# Patient Record
Sex: Female | Born: 1995 | Race: Black or African American | Hispanic: No | Marital: Single | Smoking: Never smoker
Health system: Southern US, Community
[De-identification: ages and names within clinical notes are randomized; demographics above are authoritative.]

## PROBLEM LIST (undated history)

## (undated) DIAGNOSIS — Z789 Other specified health status: Secondary | ICD-10-CM

## (undated) HISTORY — PX: NO PAST SURGERIES: SHX2092

---

## 2019-04-20 ENCOUNTER — Emergency Department (HOSPITAL_BASED_OUTPATIENT_CLINIC_OR_DEPARTMENT_OTHER)
Admission: EM | Admit: 2019-04-20 | Discharge: 2019-04-20 | Disposition: A | Discharge status: Home / Self Care | Payer: Medicaid Other | Attending: Emergency Medicine | Admitting: Emergency Medicine

## 2019-04-20 ENCOUNTER — Emergency Department (HOSPITAL_BASED_OUTPATIENT_CLINIC_OR_DEPARTMENT_OTHER): Payer: Medicaid Other

## 2019-04-20 ENCOUNTER — Other Ambulatory Visit: Payer: Self-pay

## 2019-04-20 ENCOUNTER — Encounter (HOSPITAL_BASED_OUTPATIENT_CLINIC_OR_DEPARTMENT_OTHER): Payer: Self-pay | Admitting: Emergency Medicine

## 2019-04-20 DIAGNOSIS — Y9389 Activity, other specified: Secondary | ICD-10-CM | POA: Diagnosis not present

## 2019-04-20 DIAGNOSIS — X500XXA Overexertion from strenuous movement or load, initial encounter: Secondary | ICD-10-CM | POA: Insufficient documentation

## 2019-04-20 DIAGNOSIS — Y998 Other external cause status: Secondary | ICD-10-CM | POA: Insufficient documentation

## 2019-04-20 DIAGNOSIS — Y9289 Other specified places as the place of occurrence of the external cause: Secondary | ICD-10-CM | POA: Diagnosis not present

## 2019-04-20 DIAGNOSIS — S299XXA Unspecified injury of thorax, initial encounter: Secondary | ICD-10-CM | POA: Diagnosis present

## 2019-04-20 DIAGNOSIS — S20211A Contusion of right front wall of thorax, initial encounter: Secondary | ICD-10-CM | POA: Diagnosis not present

## 2019-04-20 MED ORDER — IBUPROFEN 100 MG/5ML PO SUSP
400.0000 mg | Freq: Once | ORAL | Status: AC
  Administered 2019-04-20: 400 mg via ORAL

## 2019-04-20 MED ORDER — IBUPROFEN 100 MG/5ML PO SUSP
ORAL | Status: AC
  Filled 2019-04-20: qty 20

## 2019-04-20 MED ORDER — NAPROXEN 250 MG PO TABS
500.0000 mg | ORAL_TABLET | Freq: Once | ORAL | Status: DC
  Filled 2019-04-20: qty 2

## 2019-04-20 NOTE — ED Notes (Signed)
Patient transported to X-ray 

## 2019-04-20 NOTE — ED Provider Notes (Signed)
   [facility] Provider Note: Georgena Spurling, MD, FACEP  CSN: [id number] MRN: [id number] ARRIVAL: 04/20/19 at [place]: [place]  Chest Injury   HISTORY OF PRESENT ILLNESS  04/20/19 4:01 AM Tonea Leiphart is a 23 y.o. female walked into a pole at work at an [employer] about 2 hours ago.  She had fairly severe pain of the right side of her chest at the time but after taking off her harness and resting her pain has improved.  She rates it as a 3-4 out of 10 presently.  It is located on the right side about the mid axillary line.  It is worse with palpation.  She is not short of breath.   History reviewed. No pertinent past medical history.  History reviewed. No pertinent surgical history.  No family history on file.  Social History   Tobacco Use  . Smoking status: Never Smoker  . Smokeless tobacco: Never Used  Substance Use Topics  . Alcohol use: Not Currently  . Drug use: Not Currently    Prior to Admission medications   Not on File    Allergies Patient has no known allergies.   REVIEW OF SYSTEMS  Negative except as noted here or in the History of Present Illness.   PHYSICAL EXAMINATION  Initial Vital Signs Blood pressure 124/84, pulse (!) 103, temperature 98.8 F (37.1 C), temperature source Oral, resp. rate 16, SpO2 100 %.  Examination General: Well-developed, well-nourished female in no acute distress; appearance consistent with age of record HENT: normocephalic; atraumatic Eyes: pupils equal, round and reactive to light; extraocular muscles intact Neck: supple Heart: regular rate and rhythm Lungs: clear to auscultation bilaterally Chest: Mild right lateral rib tenderness without deformity or crepitus Abdomen: soft; nondistended; nontender; bowel sounds present Extremities: No deformity; full range of motion; pulses normal Neurologic: Awake, alert and oriented; motor function intact in all extremities and  symmetric; no facial droop Skin: Warm and dry Psychiatric: Normal mood and affect   RESULTS  Summary of this visit's results, reviewed by myself:   EKG Interpretation  Date/Time:    Ventricular Rate:    PR Interval:    QRS Duration:   QT Interval:    QTC Calculation:   R Axis:     Text Interpretation:        Laboratory Studies: No results found for this or any previous visit (from the past 24 hour(s)). Imaging Studies: Dg Ribs Unilateral W/chest Right  Result Date: 04/20/2019 CLINICAL DATA:  Walked into pole. Blunt chest trauma. Right chest and rib pain. Initial encounter. EXAM: RIGHT RIBS AND CHEST - 3+ VIEW COMPARISON:  None. FINDINGS: No fracture or other bone lesions are seen involving the ribs. There is no evidence of pneumothorax or pleural effusion. Both lungs are clear. Heart size and mediastinal contours are within normal limits. IMPRESSION: Negative. Electronically Signed   By: Marlaine Hind M.D.   On: 04/20/2019 04:25    ED COURSE and MDM  Nursing notes and initial vitals signs, including pulse oximetry, reviewed.  Vitals:   04/20/19 0359  BP: 124/84  Pulse: (!) 103  Resp: 16  Temp: 98.8 F (37.1 C)  TempSrc: Oral  SpO2: 100%    PROCEDURES    ED DIAGNOSES     ICD-10-CM   1. Chest wall contusion, right, initial encounter  G86.761P        Shanon Rosser, MD 04/20/19 [phone]

## 2019-04-20 NOTE — ED Triage Notes (Signed)
Pt reports walking into a pole at work. Reports central chest pain and back pain, R rib pain. No LOC.

## 2020-04-30 IMAGING — CR RIGHT RIBS AND CHEST - 3+ VIEW
3 series · 3 of 3 positions shown · non-contrast
Comparison: None.

CLINICAL DATA: Walked into pole. Blunt chest trauma. Right chest
and rib pain. Initial encounter.

EXAM:
RIGHT RIBS AND CHEST - 3+ VIEW

[w chest pa]
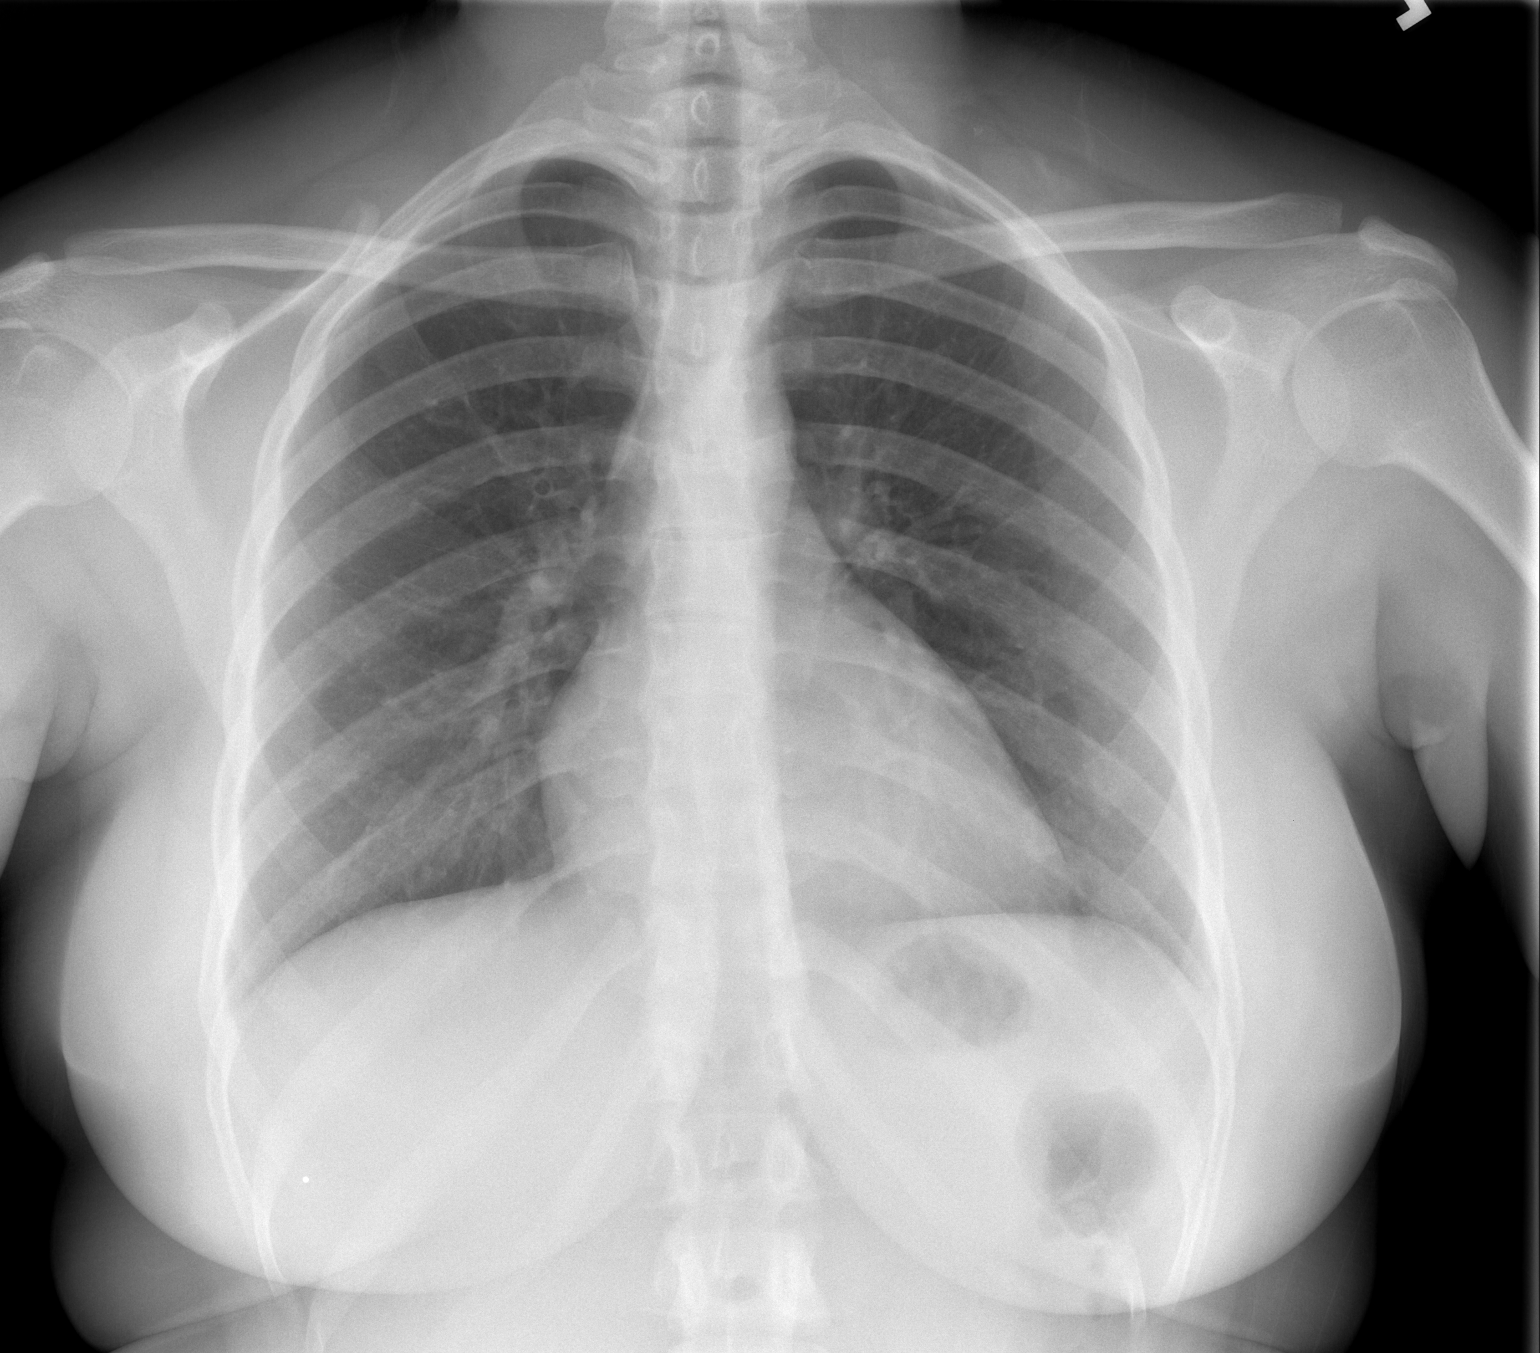

[w ribs ap/pa upper right]
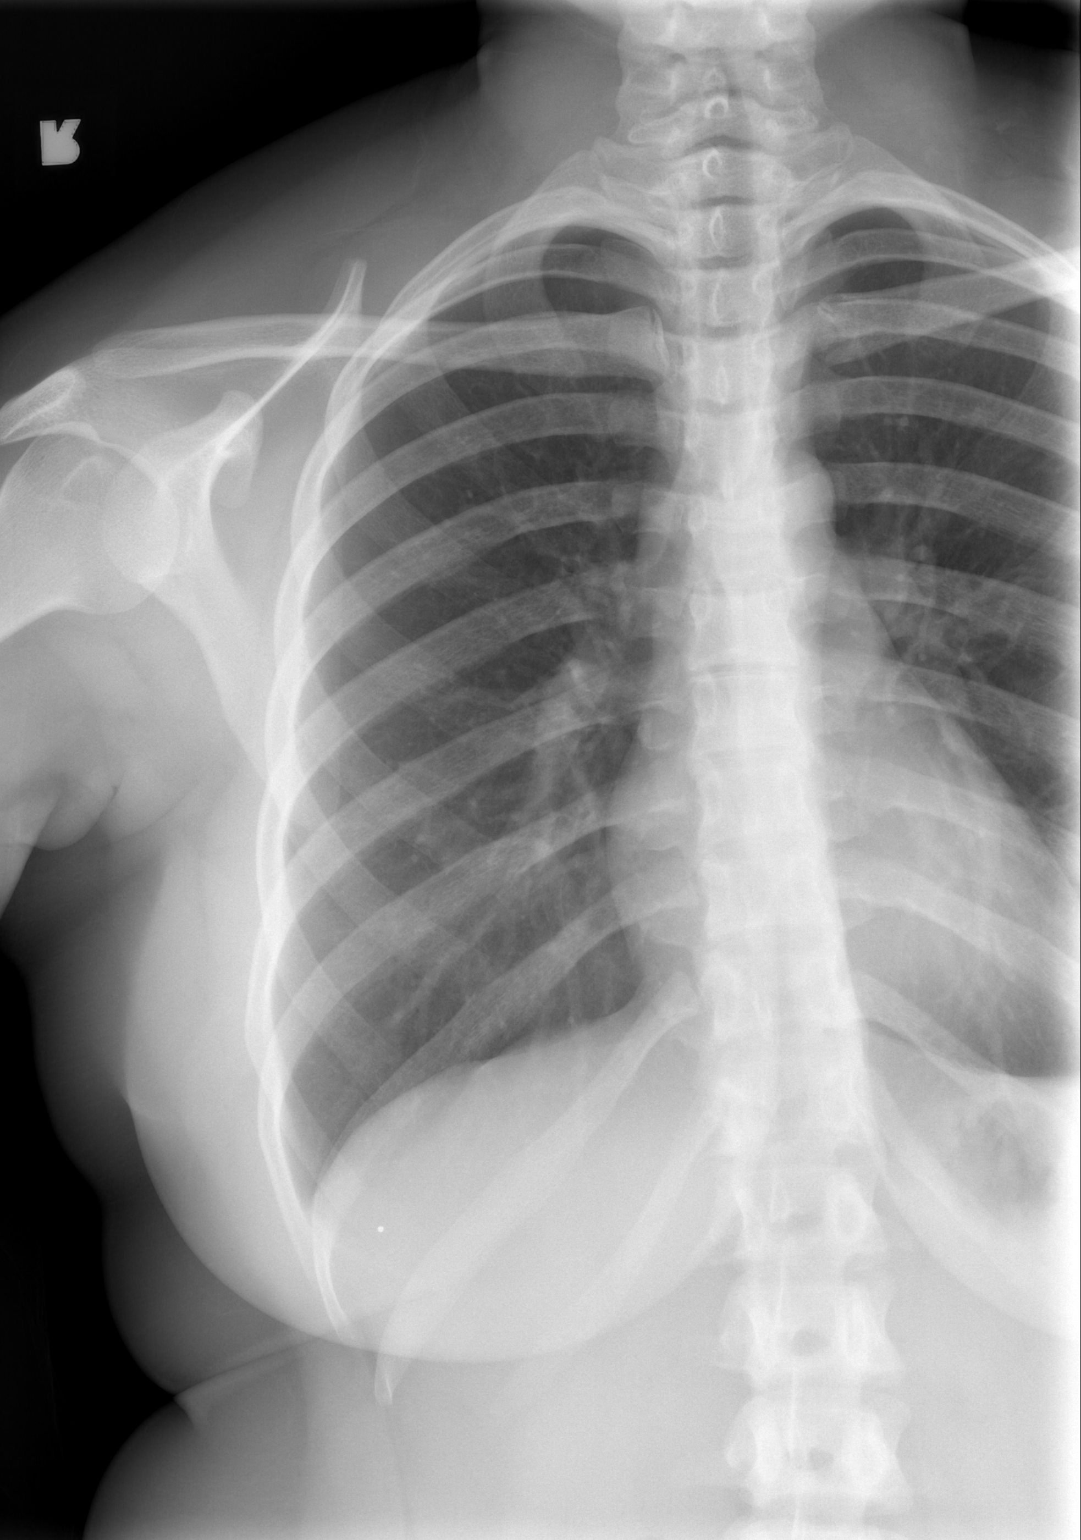

[w ribs ap/pa lower right]
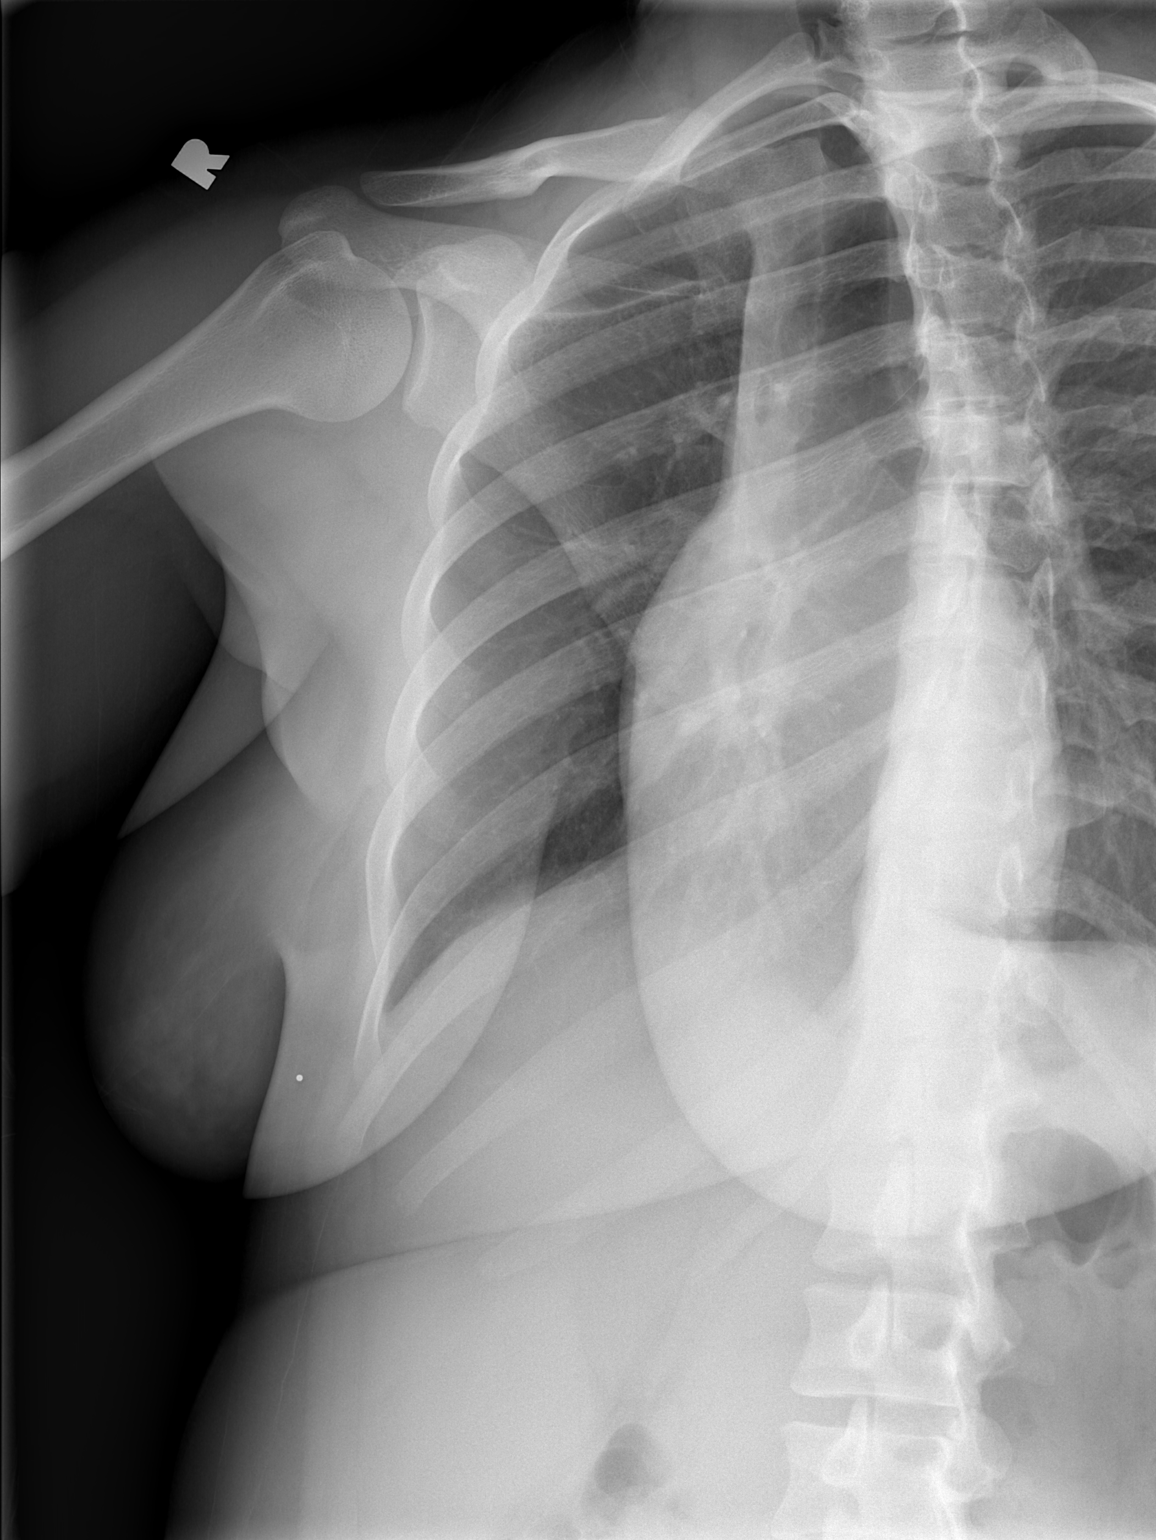

[3 of 3 positions shown; findings below may reference images not displayed]

FINDINGS: No fracture or other bone lesions are seen involving the ribs. There
is no evidence of pneumothorax or pleural effusion. Both lungs are
clear. Heart size and mediastinal contours are within normal limits.
IMPRESSION: Negative.

## 2020-07-10 ENCOUNTER — Other Ambulatory Visit: Payer: Self-pay

## 2020-07-10 ENCOUNTER — Encounter (HOSPITAL_COMMUNITY): Payer: Self-pay | Admitting: Obstetrics & Gynecology

## 2020-07-10 ENCOUNTER — Inpatient Hospital Stay (HOSPITAL_COMMUNITY)
Admission: AD | Admit: 2020-07-10 | Discharge: 2020-07-10 | Disposition: A | Discharge status: Home / Self Care | Payer: Medicaid Other | Attending: Obstetrics & Gynecology | Admitting: Obstetrics & Gynecology

## 2020-07-10 DIAGNOSIS — O4703 False labor before 37 completed weeks of gestation, third trimester: Secondary | ICD-10-CM | POA: Diagnosis present

## 2020-07-10 DIAGNOSIS — O479 False labor, unspecified: Secondary | ICD-10-CM

## 2020-07-10 DIAGNOSIS — Z3A36 36 weeks gestation of pregnancy: Secondary | ICD-10-CM | POA: Diagnosis not present

## 2020-07-10 HISTORY — DX: Other specified health status: Z78.9

## 2020-07-10 LAB — URINALYSIS, ROUTINE W REFLEX MICROSCOPIC
Bacteria, UA: NONE SEEN
Bilirubin Urine: NEGATIVE
Glucose, UA: NEGATIVE mg/dL
Hgb urine dipstick: NEGATIVE
Ketones, ur: NEGATIVE mg/dL
Nitrite: NEGATIVE
Protein, ur: NEGATIVE mg/dL
Specific Gravity, Urine: 1.019 (ref 1.005–1.030)
pH: 7 (ref 5.0–8.0)

## 2020-07-10 NOTE — Discharge Instructions (Signed)
Today you were seen for fasle labor.   Please make an appointment with your OB to be see this week.   Please call your clinic at  [phone] to make an appointment.  If you would like to deliver at [facility] please call the [facility] health at [address] [place], [place]. Call [phone] to make an appointment  Return if your symptoms worsen or you have any concerns. It was our pleasure to serve you.  Dr. Salvadore Dom    Fetal Movement Counts Patient Name: ________________________________________________ Patient Due Date: ____________________ What is a fetal movement count?  A fetal movement count is the number of times that you feel your baby move during a certain amount of time. This may also be called a fetal kick count. A fetal movement count is recommended for every pregnant woman. You may be asked to start counting fetal movements as early as week 28 of your pregnancy. Pay attention to when your baby is most active. You may notice your baby's sleep and wake cycles. You may also notice things that make your baby move more. You should do a fetal movement count:  When your baby is normally most active.  At the same time each day. A good time to count movements is while you are resting, after having something to eat and drink. How do I count fetal movements? 1. Find a quiet, comfortable area. Sit, or lie down on your side. 2. Write down the date, the start time and stop time, and the number of movements that you felt between those two times. Take this information with you to your health care visits. 3. Write down your start time when you feel the first movement. 4. Count kicks, flutters, swishes, rolls, and jabs. You should feel at least 10 movements. 5. You may stop counting after you have felt 10 movements, or if you have been counting for 2 hours. Write down the stop time. 6. If you do not feel 10 movements in 2 hours, contact your health care provider for further  instructions. Your health care provider may want to do additional tests to assess your baby's well-being. Contact a health care provider if:  You feel fewer than 10 movements in 2 hours.  Your baby is not moving like he or she usually does. Date: ____________ Start time: ____________ Stop time: ____________ Movements: ____________ Date: ____________ Start time: ____________ Stop time: ____________ Movements: ____________ Date: ____________ Start time: ____________ Stop time: ____________ Movements: ____________ Date: ____________ Start time: ____________ Stop time: ____________ Movements: ____________ Date: ____________ Start time: ____________ Stop time: ____________ Movements: ____________ Date: ____________ Start time: ____________ Stop time: ____________ Movements: ____________ Date: ____________ Start time: ____________ Stop time: ____________ Movements: ____________ Date: ____________ Start time: ____________ Stop time: ____________ Movements: ____________ Date: ____________ Start time: ____________ Stop time: ____________ Movements: ____________ This information is not intended to replace advice given to you by your health care provider. Make sure you discuss any questions you have with your health care provider. Document Revised: 04/10/2019 Document Reviewed: 04/10/2019 Elsevier Patient Education  2020 [employer]  [employer] of the uterus can occur throughout pregnancy, but they are not always a sign that you are in labor. You may have practice contractions called Braxton Hicks contractions. These false labor contractions are sometimes confused with true labor. What are Deberah Pelton contractions? Braxton Hicks contractions are tightening movements that occur in the muscles of the uterus before labor. Unlike true labor contractions, these contractions do not  result in opening (dilation) and thinning of the cervix. Toward the end of pregnancy (32-34  weeks), Braxton Hicks contractions can happen more often and may become stronger. These contractions are sometimes difficult to tell apart from true labor because they can be very uncomfortable. You should not feel embarrassed if you go to the hospital with false labor. Sometimes, the only way to tell if you are in true labor is for your health care provider to look for changes in the cervix. The health care provider will do a physical exam and may monitor your contractions. If you are not in true labor, the exam should show that your cervix is not dilating and your water has not broken. If there are no other health problems associated with your pregnancy, it is completely safe for you to be sent home with false labor. You may continue to have Braxton Hicks contractions until you go into true labor. How to tell the difference between true labor and false labor True labor  Contractions last 30-70 seconds.  Contractions become very regular.  Discomfort is usually felt in the top of the uterus, and it spreads to the lower abdomen and low back.  Contractions do not go away with walking.  Contractions usually become more intense and increase in frequency.  The cervix dilates and gets thinner. False labor  Contractions are usually shorter and not as strong as true labor contractions.  Contractions are usually irregular.  Contractions are often felt in the front of the lower abdomen and in the groin.  Contractions may go away when you walk around or change positions while lying down.  Contractions get weaker and are shorter-lasting as time goes on.  The cervix usually does not dilate or become thin. Follow these instructions at home:   Take over-the-counter and prescription medicines only as told by your health care provider.  Keep up with your usual exercises and follow other instructions from your health care provider.  Eat and drink lightly if you think you are going into  labor.  If Braxton Hicks contractions are making you uncomfortable: ? Change your position from lying down or resting to walking, or change from walking to resting. ? Sit and rest in a tub of warm water. ? Drink enough fluid to keep your urine pale yellow. Dehydration may cause these contractions. ? Do slow and deep breathing several times an hour.  Keep all follow-up prenatal visits as told by your health care provider. This is important. Contact a health care provider if:  You have a fever.  You have continuous pain in your abdomen. Get help right away if:  Your contractions become stronger, more regular, and closer together.  You have fluid leaking or gushing from your vagina.  You pass blood-tinged mucus (bloody show).  You have bleeding from your vagina.  You have low back pain that you never had before.  You feel your baby's head pushing down and causing pelvic pressure.  Your baby is not moving inside you as much as it used to. Summary  Contractions that occur before labor are called Braxton Hicks contractions, false labor, or practice contractions.  Braxton Hicks contractions are usually shorter, weaker, farther apart, and less regular than true labor contractions. True labor contractions usually become progressively stronger and regular, and they become more frequent.  Manage discomfort from [facility] contractions by changing position, resting in a warm bath, drinking plenty of water, or practicing deep breathing. This information is not intended to replace  advice given to you by your health care provider. Make sure you discuss any questions you have with your health care provider. Document Revised: 08/03/2017 Document Reviewed: 01/04/2017 Elsevier Patient Education  2020 [employer].

## 2020-07-10 NOTE — MAU Provider Note (Addendum)
S: Ms. Wynona Duhamel is a 24 y.o. G2P1 at [redacted]w[redacted]d  who presents to MAU today for labor evaluation.     Cervical exam by RN:  Dilation: 1 Effacement (%): 50 Cervical Position: Posterior, Middle Station: -2 Presentation: Vertex Exam by:: weston,rn  Fetal Monitoring: Baseline: 135 bpm Variability: moderate Accelerations: 15 x15, 10 x10 Decelerations: None Contractions: Minimal  MDM Discussed patient with RN. NST reviewed.   A: SIUP at [redacted]w[redacted]d  False labor  P: Strip Cat I and fetal monitoring reassuring Discharge home Labor precautions and kick counts included in AVS Patient to follow-up with [facility]; needs to call to make an appointment Given transportation barriers and desire to deliver at [facility], pt was also provider P# to Center for [facility] on [address] in [place] (message was also sent to [employer] to make the front desk staff aware should the pt call). Patient may return to MAU as needed or when in labor   Lavonda Jumbo, DO 07/10/2020 11:05 PM  I reviewed the FHT and agree with resident's evaluation and plan as noted above. Sheila Oats, MD OB Fellow, Faculty Practice 07/10/2020 11:19 PM

## 2020-07-10 NOTE — MAU Note (Signed)
Pt stated she started having ctx yesterday. Got stronger today where about 3-4 min apart then slowed down., got in car and they are closer again. Denies any vag bleeding or leaking at this time. Good fetal movement reported. Prenatal care at [facility] ut has not been to appointment since August.

## 2020-07-20 ENCOUNTER — Encounter (HOSPITAL_COMMUNITY): Payer: Self-pay | Admitting: Obstetrics and Gynecology

## 2020-07-20 ENCOUNTER — Other Ambulatory Visit: Payer: Self-pay

## 2020-07-20 ENCOUNTER — Inpatient Hospital Stay (HOSPITAL_COMMUNITY)
Admission: AD | Admit: 2020-07-20 | Discharge: 2020-07-20 | Disposition: A | Discharge status: Home / Self Care | Payer: Medicaid Other | Attending: Obstetrics and Gynecology | Admitting: Obstetrics and Gynecology

## 2020-07-20 DIAGNOSIS — O479 False labor, unspecified: Secondary | ICD-10-CM | POA: Insufficient documentation

## 2020-07-20 DIAGNOSIS — Z3A39 39 weeks gestation of pregnancy: Secondary | ICD-10-CM | POA: Diagnosis not present

## 2020-07-20 LAB — GC/CHLAMYDIA PROBE AMP (~~LOC~~) NOT AT ARMC
Chlamydia: NEGATIVE
Comment: NEGATIVE
Comment: NORMAL
Neisseria Gonorrhea: NEGATIVE

## 2020-07-20 NOTE — MAU Note (Signed)
PT SAYS UC STRONG SINCE 0052,   PNC WITH [facility]   DR CHRISTY GIBSON. - SEEN LAST IN AUG- [facility] SINCE THEN .

## 2020-07-20 NOTE — MAU Note (Signed)
I have communicated with Casper Harrison, MDand reviewed vital signs:  Vitals:   07/20/20 0157 07/20/20 0353  BP: 127/72 119/72  Pulse: (!) 117 96  Resp: 20   Temp: 98.8 F (37.1 C)     Vaginal exam:  Dilation: 1.5 Effacement (%): 50 Cervical Position: Posterior Station: -2 Presentation: Vertex Exam by:: Manuela Neptune, RN,   Also reviewed contraction pattern and that non-stress test is reactive.  It has been documented that patient is contracting irregularly with no cervical change over 1 hour not indicating active labor.  Patient denies any other complaints.  Based on this report provider has given order for discharge.  A discharge order and diagnosis entered by a provider.   Labor discharge instructions reviewed with patient.

## 2020-07-23 LAB — CULTURE, BETA STREP (GROUP B ONLY)

## 2020-07-30 ENCOUNTER — Inpatient Hospital Stay (HOSPITAL_COMMUNITY)
Admission: AD | Admit: 2020-07-30 | Discharge: 2020-07-30 | Disposition: A | Discharge status: Home / Self Care | Payer: Medicaid Other | Attending: Family Medicine | Admitting: Family Medicine

## 2020-07-30 ENCOUNTER — Encounter (HOSPITAL_COMMUNITY): Payer: Self-pay | Admitting: Family Medicine

## 2020-07-30 DIAGNOSIS — Z3A39 39 weeks gestation of pregnancy: Secondary | ICD-10-CM | POA: Insufficient documentation

## 2020-07-30 DIAGNOSIS — O479 False labor, unspecified: Secondary | ICD-10-CM

## 2020-07-30 DIAGNOSIS — O471 False labor at or after 37 completed weeks of gestation: Secondary | ICD-10-CM | POA: Insufficient documentation

## 2020-07-30 NOTE — MAU Note (Signed)
Contractions started at 0900, they are coming q 2-5 min, no bleeding or leaking.  Prior vag delivery. No complications with either preg.  Was 1+cm when last checked.

## 2020-07-30 NOTE — MAU Provider Note (Addendum)
S: Ms. Kelsey Snyder is a 24 y.o. G2P1001 at [redacted]w[redacted]d  who presents to MAU today for labor evaluation.     Cervical exam by RN:  Dilation: 1.5 Effacement (%): 50 Cervical Position: Posterior Exam by:: Lestine Box, RN   Fetal Monitoring: Baseline: 135  Variability: moderate Accelerations: +accels Decelerations: none Contractions: intermittent  MDM Discussed patient with RN. NST reviewed.  VSS.   A: SIUP at [redacted]w[redacted]d No contractions noted on monitoring. Cervix remains unchanged after 2 hours.  False labor  P: Discharge home Labor precautions included in AVS Patient received prenatal care at [facility] but desires delivery here.  Patient may return to MAU as needed or when in labor   Trula Slade, MD 07/30/2020 2:51 PM   I saw and evaluated the patient. I agree with the findings and the plan of care as documented in the resident's note.  Casper Harrison, MD [facility] Fellow, [facility] for [facility], [facility]

## 2021-07-07 ENCOUNTER — Other Ambulatory Visit: Payer: Self-pay

## 2021-07-07 ENCOUNTER — Encounter (HOSPITAL_COMMUNITY): Payer: Self-pay

## 2021-07-07 ENCOUNTER — Ambulatory Visit (HOSPITAL_COMMUNITY)
Admission: EM | Admit: 2021-07-07 | Discharge: 2021-07-07 | Disposition: A | Discharge status: Home / Self Care | Payer: Medicaid Other | Attending: Emergency Medicine | Admitting: Emergency Medicine

## 2021-07-07 DIAGNOSIS — R051 Acute cough: Secondary | ICD-10-CM | POA: Diagnosis not present

## 2021-07-07 DIAGNOSIS — R0981 Nasal congestion: Secondary | ICD-10-CM

## 2021-07-07 LAB — POC INFLUENZA A AND B ANTIGEN (URGENT CARE ONLY)
INFLUENZA A ANTIGEN, POC: NEGATIVE
INFLUENZA B ANTIGEN, POC: NEGATIVE

## 2021-07-07 NOTE — ED Notes (Signed)
Flu swab labeled and placed in lab

## 2021-07-07 NOTE — ED Provider Notes (Signed)
[facility]    CSN: [id number] Arrival date & time: 07/07/21  1057      History   Chief Complaint Chief Complaint  Patient presents with   Cough    HPI Kelsey Snyder is a 25 y.o. female.   Pt is here for a flu test. She is 5 months preg and her sister tested positive. She has cough, congestion, chills, and possible fever. She has only been taking tylenol. Denies any concerns with the baby.    Past Medical History:  Diagnosis Date   Medical history non-contributory     There are no problems to display for this patient.   Past Surgical History:  Procedure Laterality Date   NO PAST SURGERIES      OB History     Gravida  3   Para  1   Term  1   Preterm      AB      Living  1      SAB      IAB      Ectopic      Multiple      Live Births  1            Home Medications    Prior to Admission medications   Medication Sig Start Date End Date Taking? Authorizing Provider  Prenatal Vit-Fe Fumarate-FA (MULTIVITAMIN-PRENATAL) 27-0.8 MG TABS tablet Take 1 tablet by mouth daily at 12 noon.    [provider]    Family History Family History  Problem Relation Age of Onset   Healthy Mother    Healthy Father     Social History Social History   Tobacco Use   Smoking status: Never   Smokeless tobacco: Never  Vaping Use   Vaping Use: Never used  Substance Use Topics   Alcohol use: Not Currently   Drug use: Not Currently     Allergies   Amoxicillin   Review of Systems Review of Systems  Constitutional:  Positive for chills and fever. Negative for activity change and appetite change.  HENT:  Positive for congestion, postnasal drip, rhinorrhea, sinus pressure, sinus pain, sneezing and sore throat.   Respiratory:  Positive for cough.   Cardiovascular: Negative.   Gastrointestinal: Negative.        5 weeks preg   Genitourinary: Negative.   Neurological: Negative.     Physical Exam Triage Vital Signs ED Triage  Vitals  Enc Vitals Group     BP 07/07/21 1245 102/72     Pulse Rate 07/07/21 1245 (!) 102     Resp 07/07/21 1245 19     Temp 07/07/21 1245 99.1 F (37.3 C)     Temp src --      SpO2 07/07/21 1245 100 %     Weight --      Height --      Head Circumference --      Peak Flow --      Pain Score 07/07/21 1243 0     Pain Loc --      Pain Edu? --      Excl. in GC? --    No data found.  Updated Vital Signs BP 102/72   Pulse (!) 102   Temp 99.1 F (37.3 C)   Resp 19   SpO2 100%   Visual Acuity Right Eye Distance:   Left Eye Distance:   Bilateral Distance:    Right Eye Near:   Left Eye Near:  Bilateral Near:     Physical Exam Constitutional:      Appearance: Normal appearance.  HENT:     Right Ear: Tympanic membrane normal.     Left Ear: Tympanic membrane normal.     Nose: Congestion and rhinorrhea present.     Mouth/Throat:     Mouth: Mucous membranes are moist.  Eyes:     Pupils: Pupils are equal, round, and reactive to light.  Cardiovascular:     Rate and Rhythm: Tachycardia present.  Pulmonary:     Effort: Pulmonary effort is normal.  Abdominal:     Comments: 5 weeks preg   Musculoskeletal:     Cervical back: Normal range of motion.  Skin:    General: Skin is warm.  Neurological:     General: No focal deficit present.     Mental Status: She is alert.     UC Treatments / Results  Labs (all labs ordered are listed, but only abnormal results are displayed) Labs Reviewed  POC INFLUENZA A AND B ANTIGEN (URGENT CARE ONLY)    EKG   Radiology No results found.  Procedures Procedures (including critical care time)  Medications Ordered in UC Medications - No data to display  Initial Impression / Assessment and Plan / UC Course  I have reviewed the triage vital signs and the nursing notes.  Pertinent labs & imaging results that were available during my care of the patient were reviewed by me and considered in my medical decision making (see chart  for details).     Go to women's er if you begin to have complications or vaginal bleeding  You can take benadryl , tylenol or mucinex as needed  Keep your obgyn appoint next week  Final Clinical Impressions(s) / UC Diagnoses   Final diagnoses:  Acute cough  Congestion of nasal sinus     Discharge Instructions      Go to women's er if you begin to have complications or vaginal bleeding  You can take benadryl , tylenol or mucinex as needed  Keep your obgyn appoint next week  Your flu test was negative your symptoms could be more viral       ED Prescriptions   None    PDMP not reviewed this encounter.   Coralyn Mark, NP 07/07/21 1428

## 2021-07-07 NOTE — Discharge Instructions (Addendum)
Go to women's er if you begin to have complications or vaginal bleeding  You can take benadryl , tylenol or mucinex as needed  Keep your obgyn appoint next week  Your flu test was negative your symptoms could be more viral

## 2021-07-07 NOTE — ED Triage Notes (Signed)
Pt presents with complaints of cough, fatigue, pain with cough, and headache since Monday. Pt 5 months pregnant. Pt sister has flu and was around her.

## 2021-07-11 LAB — OB RESULTS CONSOLE HIV ANTIBODY (ROUTINE TESTING): HIV: NONREACTIVE

## 2021-07-11 LAB — OB RESULTS CONSOLE HEPATITIS B SURFACE ANTIGEN: Hepatitis B Surface Ag: NEGATIVE

## 2021-07-11 LAB — HEPATITIS C ANTIBODY: HCV Ab: NEGATIVE

## 2021-07-11 LAB — OB RESULTS CONSOLE VARICELLA ZOSTER ANTIBODY, IGG: Varicella: NON-IMMUNE/NOT IMMUNE

## 2021-07-11 LAB — OB RESULTS CONSOLE RPR: RPR: NONREACTIVE

## 2021-07-11 LAB — OB RESULTS CONSOLE RUBELLA ANTIBODY, IGM: Rubella: IMMUNE

## 2021-09-04 NOTE — L&D Delivery Note (Signed)
OB/GYN Faculty Practice Delivery Note ? ?Kelsey Snyder is a 26 y.o. G3P2002 s/p SVD at [redacted]w[redacted]d. She was admitted for spontaneous labor.  ? ?ROM: 4h 42m with clear fluid ?GBS Status: negative ?Maximum Maternal Temperature: 98.9 ? ?Labor Progress: ?Presented in spontaneous labor and was augmented with pitocin and AROM and progressed to complete ? ?Delivery Date/Time: 1812 on 11/11/2021 ?Delivery: Called to room and patient was complete and pushing. Head delivered ROA. Nuchal cord x1 present and reduced easily. Shoulder and body delivered in usual fashion. Infant with spontaneous cry, placed on mother's abdomen, dried and stimulated. Cord clamped x 2 after 1-minute delay, and cut by father of baby under my direct supervision. Cord blood drawn. Placenta delivered spontaneously with gentle cord traction. Fundus firm with massage and Pitocin. Labia, perineum, vagina, and cervix inspected and found to have no lacerations.  ? ?Placenta: intact, 3V cord, to L&D ?Complications: None ?Lacerations: None ?EBL: 50cc ?Analgesia: epidural ? ? ?Infant: female  APGARs 9,9  weight pending ? ?Warner Mccreedy, MD, MPH ?OB Fellow, Faculty Practice ?Center for [employer], [facility] ?11/11/2021, 6:58 PM ? ? ? ? ? ?

## 2021-09-08 LAB — OB RESULTS CONSOLE RPR: RPR: NONREACTIVE

## 2021-11-03 LAB — OB RESULTS CONSOLE GBS: GBS: NEGATIVE

## 2021-11-11 ENCOUNTER — Other Ambulatory Visit: Payer: Self-pay

## 2021-11-11 ENCOUNTER — Inpatient Hospital Stay (HOSPITAL_COMMUNITY): Payer: Medicaid Other | Admitting: Anesthesiology

## 2021-11-11 ENCOUNTER — Inpatient Hospital Stay (HOSPITAL_COMMUNITY)
Admission: AD | Admit: 2021-11-11 | Discharge: 2021-11-12 | DRG: 807 | Disposition: A | Discharge status: Home / Self Care | Payer: Medicaid Other | Attending: Obstetrics and Gynecology | Admitting: Obstetrics and Gynecology

## 2021-11-11 ENCOUNTER — Encounter (HOSPITAL_COMMUNITY): Payer: Self-pay | Admitting: Obstetrics and Gynecology

## 2021-11-11 DIAGNOSIS — Z3A38 38 weeks gestation of pregnancy: Secondary | ICD-10-CM

## 2021-11-11 DIAGNOSIS — Z20822 Contact with and (suspected) exposure to covid-19: Secondary | ICD-10-CM | POA: Diagnosis present

## 2021-11-11 DIAGNOSIS — O26893 Other specified pregnancy related conditions, third trimester: Secondary | ICD-10-CM | POA: Diagnosis present

## 2021-11-11 LAB — CBC
HCT: 31.9 % — ABNORMAL LOW (ref 36.0–46.0)
Hemoglobin: 10.2 g/dL — ABNORMAL LOW (ref 12.0–15.0)
MCH: 26.6 pg (ref 26.0–34.0)
MCHC: 32 g/dL (ref 30.0–36.0)
MCV: 83.1 fL (ref 80.0–100.0)
Platelets: 288 10*3/uL (ref 150–400)
RBC: 3.84 MIL/uL — ABNORMAL LOW (ref 3.87–5.11)
RDW: 15.9 % — ABNORMAL HIGH (ref 11.5–15.5)
WBC: 10.4 10*3/uL (ref 4.0–10.5)
nRBC: 0 % (ref 0.0–0.2)

## 2021-11-11 LAB — TYPE AND SCREEN
ABO/RH(D): A POS
Antibody Screen: NEGATIVE

## 2021-11-11 LAB — RPR: RPR Ser Ql: NONREACTIVE

## 2021-11-11 LAB — RESP PANEL BY RT-PCR (FLU A&B, COVID) ARPGX2
Influenza A by PCR: NEGATIVE
Influenza B by PCR: NEGATIVE
SARS Coronavirus 2 by RT PCR: NEGATIVE

## 2021-11-11 MED ORDER — FENTANYL CITRATE (PF) 100 MCG/2ML IJ SOLN: 50.0000 ug | INTRAMUSCULAR | Status: DC | PRN

## 2021-11-11 MED ORDER — ONDANSETRON HCL 4 MG/2ML IJ SOLN: 4.0000 mg | INTRAMUSCULAR | Status: DC | PRN

## 2021-11-11 MED ORDER — EPHEDRINE 5 MG/ML INJ: 10.0000 mg | INTRAVENOUS | Status: DC | PRN

## 2021-11-11 MED ORDER — DIPHENHYDRAMINE HCL 25 MG PO CAPS: 25.0000 mg | ORAL_CAPSULE | Freq: Four times a day (QID) | ORAL | Status: DC | PRN

## 2021-11-11 MED ORDER — OXYTOCIN-SODIUM CHLORIDE 30-0.9 UT/500ML-% IV SOLN
2.5000 [IU]/h | INTRAVENOUS | Status: DC
  Administered 2021-11-11: 2.5 [IU]/h via INTRAVENOUS

## 2021-11-11 MED ORDER — MEDROXYPROGESTERONE ACETATE 150 MG/ML IM SUSP: 150.0000 mg | INTRAMUSCULAR | Status: DC | PRN

## 2021-11-11 MED ORDER — FENTANYL-BUPIVACAINE-NACL 0.5-0.125-0.9 MG/250ML-% EP SOLN
12.0000 mL/h | EPIDURAL | Status: DC | PRN
  Administered 2021-11-11: 12 mL/h via EPIDURAL
  Filled 2021-11-11: qty 250

## 2021-11-11 MED ORDER — COCONUT OIL OIL: 1.0000 "application " | TOPICAL_OIL | Status: DC | PRN

## 2021-11-11 MED ORDER — LIDOCAINE-EPINEPHRINE (PF) 1.5 %-1:200000 IJ SOLN
INTRAMUSCULAR | Status: DC | PRN
  Administered 2021-11-11: 3 mL via EPIDURAL
  Administered 2021-11-11: 2 mL via EPIDURAL

## 2021-11-11 MED ORDER — ACETAMINOPHEN 325 MG PO TABS: 650.0000 mg | ORAL_TABLET | ORAL | Status: DC | PRN

## 2021-11-11 MED ORDER — MEASLES, MUMPS & RUBELLA VAC IJ SOLR: 0.5000 mL | Freq: Once | INTRAMUSCULAR | Status: DC

## 2021-11-11 MED ORDER — TETANUS-DIPHTH-ACELL PERTUSSIS 5-2.5-18.5 LF-MCG/0.5 IM SUSY: 0.5000 mL | PREFILLED_SYRINGE | Freq: Once | INTRAMUSCULAR | Status: DC

## 2021-11-11 MED ORDER — IBUPROFEN 100 MG/5ML PO SUSP
600.0000 mg | Freq: Four times a day (QID) | ORAL | Status: DC
  Administered 2021-11-12: 600 mg via ORAL
  Filled 2021-11-11 (×2): qty 30

## 2021-11-11 MED ORDER — LACTATED RINGERS IV SOLN: INTRAVENOUS | Status: DC

## 2021-11-11 MED ORDER — ACETAMINOPHEN 500 MG PO TABS
1000.0000 mg | ORAL_TABLET | Freq: Once | ORAL | Status: AC
  Administered 2021-11-11: 1000 mg via ORAL
  Filled 2021-11-11: qty 2

## 2021-11-11 MED ORDER — ONDANSETRON HCL 4 MG/2ML IJ SOLN: 4.0000 mg | Freq: Four times a day (QID) | INTRAMUSCULAR | Status: DC | PRN

## 2021-11-11 MED ORDER — OXYTOCIN-SODIUM CHLORIDE 30-0.9 UT/500ML-% IV SOLN
1.0000 m[IU]/min | INTRAVENOUS | Status: DC
  Administered 2021-11-11: 2 m[IU]/min via INTRAVENOUS
  Filled 2021-11-11: qty 500

## 2021-11-11 MED ORDER — WITCH HAZEL-GLYCERIN EX PADS: 1.0000 "application " | MEDICATED_PAD | CUTANEOUS | Status: DC | PRN

## 2021-11-11 MED ORDER — BENZOCAINE-MENTHOL 20-0.5 % EX AERO
1.0000 | INHALATION_SPRAY | CUTANEOUS | Status: DC | PRN
Start: 2021-11-11 — End: 2021-11-12

## 2021-11-11 MED ORDER — SIMETHICONE 80 MG PO CHEW: 80.0000 mg | CHEWABLE_TABLET | ORAL | Status: DC | PRN

## 2021-11-11 MED ORDER — FLEET ENEMA 7-19 GM/118ML RE ENEM: 1.0000 | ENEMA | RECTAL | Status: DC | PRN

## 2021-11-11 MED ORDER — OXYCODONE-ACETAMINOPHEN 5-325 MG PO TABS: 2.0000 | ORAL_TABLET | ORAL | Status: DC | PRN

## 2021-11-11 MED ORDER — OXYTOCIN BOLUS FROM INFUSION
333.0000 mL | Freq: Once | INTRAVENOUS | Status: AC
  Administered 2021-11-11: 333 mL via INTRAVENOUS

## 2021-11-11 MED ORDER — SOD CITRATE-CITRIC ACID 500-334 MG/5ML PO SOLN: 30.0000 mL | ORAL | Status: DC | PRN

## 2021-11-11 MED ORDER — DIBUCAINE (PERIANAL) 1 % EX OINT: 1.0000 "application " | TOPICAL_OINTMENT | CUTANEOUS | Status: DC | PRN

## 2021-11-11 MED ORDER — OXYCODONE-ACETAMINOPHEN 5-325 MG PO TABS: 1.0000 | ORAL_TABLET | ORAL | Status: DC | PRN

## 2021-11-11 MED ORDER — IBUPROFEN 600 MG PO TABS
600.0000 mg | ORAL_TABLET | Freq: Four times a day (QID) | ORAL | Status: DC
  Administered 2021-11-11: 600 mg via ORAL
  Filled 2021-11-11: qty 1

## 2021-11-11 MED ORDER — SENNOSIDES-DOCUSATE SODIUM 8.6-50 MG PO TABS
2.0000 | ORAL_TABLET | Freq: Every day | ORAL | Status: DC
  Filled 2021-11-11: qty 2

## 2021-11-11 MED ORDER — DIPHENHYDRAMINE HCL 50 MG/ML IJ SOLN: 12.5000 mg | INTRAMUSCULAR | Status: DC | PRN

## 2021-11-11 MED ORDER — PHENYLEPHRINE 40 MCG/ML (10ML) SYRINGE FOR IV PUSH (FOR BLOOD PRESSURE SUPPORT): 80.0000 ug | PREFILLED_SYRINGE | INTRAVENOUS | Status: DC | PRN

## 2021-11-11 MED ORDER — PRENATAL MULTIVITAMIN CH
1.0000 | ORAL_TABLET | Freq: Every day | ORAL | Status: DC
  Filled 2021-11-11: qty 1

## 2021-11-11 MED ORDER — HYDROXYZINE HCL 50 MG PO TABS: 50.0000 mg | ORAL_TABLET | Freq: Four times a day (QID) | ORAL | Status: DC | PRN

## 2021-11-11 MED ORDER — LACTATED RINGERS IV SOLN: 500.0000 mL | INTRAVENOUS | Status: DC | PRN

## 2021-11-11 MED ORDER — ONDANSETRON HCL 4 MG PO TABS: 4.0000 mg | ORAL_TABLET | ORAL | Status: DC | PRN

## 2021-11-11 MED ORDER — LACTATED RINGERS IV SOLN: 500.0000 mL | Freq: Once | INTRAVENOUS | Status: DC

## 2021-11-11 MED ORDER — LIDOCAINE HCL (PF) 1 % IJ SOLN
30.0000 mL | INTRAMUSCULAR | Status: DC | PRN
Start: 2021-11-11 — End: 2021-11-11

## 2021-11-11 MED ORDER — TERBUTALINE SULFATE 1 MG/ML IJ SOLN: 0.2500 mg | Freq: Once | INTRAMUSCULAR | Status: DC | PRN

## 2021-11-11 NOTE — Progress Notes (Signed)
Patient now comfortable after epidural placed. Discussed cervical check and possible AROM if cervix unchanged. Patient amenable. ? ?Cervix ~4-5 cm/60-70% effaced/-1 to -2 station.  ? ?Head well applied. AROMed during current check. Tolerated well by patient and fetus. ? ?Will plan to uptitrate pitocin as appropriate and reassess in 3-4 hours or sooner if clinically indicated. ? ?Warner Mccreedy, MD, MPH ?OB Fellow, Faculty Practice ? ?

## 2021-11-11 NOTE — Discharge Summary (Signed)
? ?  Postpartum Discharge Summary ? ?   ?Patient Name: Kelsey Snyder ?DOB: 1996/05/11 ?MRN: [id number] ? ?Date of admission: 11/11/2021 ?Delivery date:11/11/2021  ?Delivering provider: Renard Matter  ?Date of discharge: 11/12/2021 ? ?Admitting diagnosis: Indication for care in labor or delivery [O75.9] ?Intrauterine pregnancy: [redacted]w[redacted]d    ?Secondary diagnosis:  Principal Problem: ?  Indication for care in labor or delivery ?Active Problems: ?  Vaginal delivery ? ?Additional problems: None    ?Discharge diagnosis: Term Pregnancy Delivered                                              ?Post partum procedures: none ?Augmentation: AROM and Pitocin ?Complications: None ? ?Hospital course: Onset of Labor With Vaginal Delivery      ?26y.o. yo G3P2002 at 33w4das admitted in Latent Labor on 11/11/2021. Patient had an uncomplicated labor course as follows:  ?Membrane Rupture Time/Date: 1:39 PM ,11/11/2021   ?Delivery Method:Vaginal, Spontaneous  ?Episiotomy: None  ?Lacerations:  None  ?Patient had an uncomplicated postpartum course.  She is ambulating, tolerating a regular diet, passing flatus, and urinating well. Patient is discharged home in stable condition on 11/12/21 per her request for early d/c as long as the baby can go as well. ? ?Newborn Data: ?Birth date:11/11/2021  ?Birth time:6:12 PM  ?Gender:Female  ?Living status:Living  ?Apgars:9 ,9  ?Weight:3480 g (7lb 10.8oz) ? ?Magnesium Sulfate received: No ?BMZ received: No ?Rhophylac:N/A ?MMR:N/A ?T-DaP: offered postpartum ?Flu: No ?Transfusion:No ? ?Physical exam  ?Vitals:  ? 11/11/21 2028 11/11/21 2123 11/12/21 0130 11/12/21 0521  ?BP: 122/81 119/81 124/73 113/80  ?Pulse: (!) 104 (!) 111 93 94  ?Resp: 18 18 16 16   ?Temp: 98.9 ?F (37.2 ?C) 99.9 ?F (37.7 ?C) 98 ?F (36.7 ?C) 98.1 ?F (36.7 ?C)  ?TempSrc: Oral Oral Oral Oral  ?SpO2: 100% 100% 100% 100%  ?Weight:      ?Height:      ? ?General: alert and cooperative ?Lochia: appropriate ?Uterine Fundus: firm ?Incision: N/A ?DVT  Evaluation: No evidence of DVT seen on physical exam. ?Labs: ?Lab Results  ?Component Value Date  ? WBC 10.4 11/11/2021  ? HGB 10.2 (L) 11/11/2021  ? HCT 31.9 (L) 11/11/2021  ? MCV 83.1 11/11/2021  ? PLT 288 11/11/2021  ? ?No flowsheet data found. ?Edinburgh Score: ?Edinburgh Postnatal Depression Scale Screening Tool 11/12/2021  ?I have been able to laugh and see the funny side of things. 0  ?I have looked forward with enjoyment to things. 0  ?I have blamed myself unnecessarily when things went wrong. 1  ?I have been anxious or worried for no good reason. 2  ?I have felt scared or panicky for no good reason. 1  ?Things have been getting on top of me. 1  ?I have been so unhappy that I have had difficulty sleeping. 0  ?I have felt sad or miserable. 1  ?I have been so unhappy that I have been crying. 0  ?The thought of harming myself has occurred to me. 0  ?Edinburgh Postnatal Depression Scale Total 6  ? ? ? ?After visit meds:  ?Allergies as of 11/12/2021   ? ?   Reactions  ? Amoxicillin Rash  ? ?  ? ?  ?Medication List  ?  ? ?TAKE these medications   ? ?ibuprofen 100 MG/5ML suspension ?Commonly known as: ADVIL ?Take 30 mLs (  600 mg total) by mouth every 6 (six) hours. ?  ?multivitamin-prenatal 27-0.8 MG Tabs tablet ?Take 1 tablet by mouth daily at 12 noon. ?  ? ?  ? ? ? ?Discharge home in stable condition ?Infant Feeding: Bottle ?Infant Disposition:home with mother ?Discharge instruction: per After Visit Summary and Postpartum booklet. ?Activity: Advance as tolerated. Pelvic rest for 6 weeks.  ?Diet: routine diet ?Future Appointments:No future appointments. ?Follow up Visit: ? Follow-up Information   ? ? OB provider- Cherrie Gauze Follow up in 4 week(s).   ?Why: For your postpartum appointment ? ?  ?  ? ?  ?  ? ?  ? ?Patient to follow up at [place] (where she received her prenatal care) ? ?Please schedule this patient for a In person postpartum visit in 4 weeks with the following provider: Any provider. ?Additional  Postpartum F/U: None   ?Low risk pregnancy complicated by:  None ?Delivery mode:  Vaginal, Spontaneous  ?Anticipated Birth Control:   declines ? ? ?11/12/2021 ?Myrtis Ser, CNM ?7:49 AM ? ? ? ? ?

## 2021-11-11 NOTE — H&P (Signed)
?Kelsey Snyder is a 26 y.o. female G3P2002 with IUP at [redacted]w[redacted]d by [place] c/w LMPpresenting for SOL ?She reports positive fetal movement. She denies leakage of fluid or vaginal bleeding. ? ?Prenatal History/Complications: ?PNC at [facility] ?Pregnancy complications:  ?- Past Medical History: ?Past Medical History:  ?Diagnosis Date  ? Medical history non-contributory   ? ? ?Past Surgical History: ?Past Surgical History:  ?Procedure Laterality Date  ? NO PAST SURGERIES    ? ? ?Obstetrical History: ?OB History   ? ? Gravida  ?3  ? Para  ?2  ? Term  ?2  ? Preterm  ?   ? AB  ?   ? Living  ?2  ?  ? ? SAB  ?   ? IAB  ?   ? Ectopic  ?   ? Multiple  ?   ? Live Births  ?2  ?   ?  ?  ? ? ? ?Social History: ?Social History  ? ?Socioeconomic History  ? Marital status: Single  ?  Spouse name: Not on file  ? Number of children: Not on file  ? Years of education: Not on file  ? Highest education level: Not on file  ?Occupational History  ? Not on file  ?Tobacco Use  ? Smoking status: Never  ? Smokeless tobacco: Never  ?Vaping Use  ? Vaping Use: Never used  ?Substance and Sexual Activity  ? Alcohol use: Not Currently  ? Drug use: Not Currently  ? Sexual activity: Yes  ?Other Topics Concern  ? Not on file  ?Social History Narrative  ? Not on file  ? ?Social Determinants of Health  ? ?Financial Resource Strain: Not on file  ?Food Insecurity: Not on file  ?Transportation Needs: Not on file  ?Physical Activity: Not on file  ?Stress: Not on file  ?Social Connections: Not on file  ? ? ?Family History: ?Family History  ?Problem Relation Age of Onset  ? Healthy Mother   ? Healthy Father   ? ? ?Allergies: ?Allergies  ?Allergen Reactions  ? Amoxicillin Rash  ? ? ?Medications Prior to Admission  ?Medication Sig Dispense Refill Last Dose  ? Prenatal Vit-Fe Fumarate-FA (MULTIVITAMIN-PRENATAL) 27-0.8 MG TABS tablet Take 1 tablet by mouth daily at 12 noon.     ? ? ?Review of Systems  ? ?Constitutional: Negative for fever and chills ?Eyes: Negative for  visual disturbances ?Respiratory: Negative for shortness of breath, dyspnea ?Cardiovascular: Negative for chest pain or palpitations  ?Gastrointestinal: Negative for vomiting, diarrhea and constipation.  POSITIVE for abdominal pain (contractions) ?Genitourinary: Negative for dysuria and urgency ?Musculoskeletal: Negative for back pain, joint pain, myalgias  ?Neurological: Negative for dizziness and headaches ? ?Blood pressure 103/64, pulse 96, temperature 98.7 ?F (37.1 ?C), temperature source Oral, resp. rate 18, height 5\' 3"  (1.6 m), weight 93.9 kg, SpO2 98 %, unknown if currently breastfeeding. ?General appearance: alert, cooperative, and no distress ?Lungs: normal respiratory effort ?Heart: regular rate and rhythm ?Abdomen: soft, non-tender; bowel sounds normal ?Extremities: Homans sign is negative, no sign of DVT ?DTR's 2+ ?Presentation: cephalic ?Fetal monitoring  Baseline: 150 bpm mod var present acel, no decels ?Uterine activity  q2-3 minutes per patient, not tracing on tocometer ?Dilation: 5 ?Effacement (%): 70 ?Station: -2, -1 ?Exam by:: jolynn ? ? ?Prenatal labs: ?ABO, Rh: --/--/PENDING (03/10 0800) ?Antibody: PENDING (03/10 0800) ?Rubella: Immune (11/07 0000) ?RPR: Nonreactive (01/05 0000)  ?HBsAg: Negative (11/07 0000)  ?HIV: Non-reactive (11/07 0000)  ?GBS: Negative/-- (03/02 0000)  ?1 hr Glucola  109 (passed) ?Genetic screening  declined with this pregnancy  ?Anatomy US normal per patient ? ?Prenatal Transfer Tool  ?Maternal Diabetes: No ?Genetic Screening: Declined ?Maternal Ultrasounds/Referrals: Normal ?Fetal Ultrasounds or other Referrals:  None ?Maternal Substance Abuse:  No ?Significant Maternal Medications:  Meds include: Other: prenatal vitamins ?Significant Maternal Lab Results: Group B Strep negative ? ?Results for orders placed or performed during the hospital encounter of 11/11/21 (from the past 24 hour(s))  ?CBC  ? Collection Time: 11/11/21  7:58 AM  ?Result Value Ref Range  ? WBC 10.4 4.0  - 10.5 K/uL  ? RBC 3.84 (L) 3.87 - 5.11 MIL/uL  ? Hemoglobin 10.2 (L) 12.0 - 15.0 g/dL  ? HCT 31.9 (L) 36.0 - 46.0 %  ? MCV 83.1 80.0 - 100.0 fL  ? MCH 26.6 26.0 - 34.0 pg  ? MCHC 32.0 30.0 - 36.0 g/dL  ? RDW 15.9 (H) 11.5 - 15.5 %  ? Platelets 288 150 - 400 K/uL  ? nRBC 0.0 0.0 - 0.2 %  ?Type and screen [facility]  ? Collection Time: 11/11/21  8:00 AM  ?Result Value Ref Range  ? ABO/RH(D) PENDING   ? Antibody Screen PENDING   ? Sample Expiration    ?  11/14/2021,2359 ?Performed at [facility], [address] [place], [place] [address] ?  ? ? ?Assessment: ?Kelsey Snyder is a 26 y.o. [id number] with an IUP at [redacted]w[redacted]d presenting for labor. She declines epidural at this time; is coping well with contractions.  ?Plan: ?#Labor: expectant management ?#Pain:  Per request ?#FWB Cat 1 ?#ID: GBS: neg  ?#MOF:  bottle  ?#MOC: declines ?#Circ: NA (female) ? ? ?Charlesetta Garibaldi Clemence Stillings ?11/11/2021, 8:54 AM ? ? ?

## 2021-11-11 NOTE — Progress Notes (Signed)
Kelsey Snyder is a 26 y.o. G3P2002 at [redacted]w[redacted]d admitted for spontaneous labor ? ?Subjective: ?Reports feeling discomfort with contractions. Would like her epidural.  ? ?Objective: ?BP 116/70   Pulse (!) 111   Temp 98.9 ?F (37.2 ?C) (Oral)   Resp 18   Ht 5\' 3"  (1.6 m)   Wt 93.9 kg   SpO2 98%   BMI 36.67 kg/m?  ?No intake/output data recorded. ?No intake/output data recorded. ? ?FHT:  FHR: 135-140 bpm, variability: moderate,  accelerations:  Present,  decelerations:  Absent ?UC:   irregular, at times every 1-3 min, other times more spaced out ?SVE:   Dilation: 5 ?Effacement (%): 70 ?Station: -2 ?Exam by:: [email or web address] CNM ? ?Labs: ?Lab Results  ?Component Value Date  ? WBC 10.4 11/11/2021  ? HGB 10.2 (L) 11/11/2021  ? HCT 31.9 (L) 11/11/2021  ? MCV 83.1 11/11/2021  ? PLT 288 11/11/2021  ? ? ?Assessment / Plan: ?01/11/2022 at [redacted]w[redacted]d admitted for spontaneous labor ? ?Labor:  presented in spontaneous labor (changed from 3 to 5 cm )and was initially expectantly managed. Her contractions then spaced out and she was started on Pitocin. Currently getting more uncomfortable and requesting epidural. Will plan for epidural and then reassess cervix. Discuss AROM at next check. ?Fetal Wellbeing:  Category I ?Pain Control:  Epidural ?I/D:   GBS negative ? ? ?[redacted]w[redacted]d ?11/11/2021, 2:04 PM ? ? ?

## 2021-11-11 NOTE — MAU Note (Addendum)
.  Kelsey Snyder is a 26 y.o. at [redacted]w[redacted]d here in MAU by EMS reporting: ctx every 2 mins ?Onset of complaint: 0400 ?Pain score: 8/10 ?There were no vitals filed for this visit.   ?FHT:145 ? ?Lab orders placed from triage:    ?

## 2021-11-11 NOTE — Anesthesia Preprocedure Evaluation (Signed)
Anesthesia Evaluation  Patient identified by MRN, date of birth, ID band Patient awake    Reviewed: Allergy & Precautions, Patient's Chart, lab work & pertinent test results  Airway Mallampati: II  TM Distance: >3 FB Neck ROM: Full    Dental no notable dental hx.    Pulmonary neg pulmonary ROS,    Pulmonary exam normal breath sounds clear to auscultation       Cardiovascular negative cardio ROS Normal cardiovascular exam Rhythm:Regular Rate:Normal     Neuro/Psych negative neurological ROS     GI/Hepatic negative GI ROS, Neg liver ROS,   Endo/Other  negative endocrine ROS  Renal/GU negative Renal ROS     Musculoskeletal negative musculoskeletal ROS (+)   Abdominal (+) + obese,   Peds  Hematology negative hematology ROS (+)   Anesthesia Other Findings   Reproductive/Obstetrics (+) Pregnancy                             Anesthesia Physical Anesthesia Plan  ASA: 2  Anesthesia Plan: Epidural   Post-op Pain Management:    Induction:   PONV Risk Score and Plan:   Airway Management Planned:   Additional Equipment:   Intra-op Plan:   Post-operative Plan:   Informed Consent: I have reviewed the patients History and Physical, chart, labs and discussed the procedure including the risks, benefits and alternatives for the proposed anesthesia with the patient or authorized representative who has indicated his/her understanding and acceptance.       Plan Discussed with:   Anesthesia Plan Comments:         Anesthesia Quick Evaluation  

## 2021-11-11 NOTE — Anesthesia Procedure Notes (Addendum)
Epidural ?Patient location during procedure: OB ?Start time: 11/11/2021 1:53 PM ?End time: 11/11/2021 2:12 PM ? ?Staffing ?Anesthesiologist: Nolon Nations, MD ?Performed: anesthesiologist  ? ?Preanesthetic Checklist ?Completed: patient identified, IV checked, risks and benefits discussed, monitors and equipment checked, pre-op evaluation and timeout performed ? ?Epidural ?Patient position: sitting ?Prep: DuraPrep and site prepped and draped ?Patient monitoring: heart rate, continuous pulse ox and blood pressure ?Approach: midline ?Location: L3-L4 ?Injection technique: LOR air and LOR saline ? ?Needle:  ?Needle type: Tuohy  ?Needle gauge: 17 G ?Needle length: 9 cm ?Needle insertion depth: 6 cm ?Catheter type: closed end flexible ?Catheter size: 19 Gauge ?Catheter at skin depth: 12 cm ?Test dose: negative ? ?Assessment ?Sensory level: T8 ?Events: blood not aspirated, injection not painful, no injection resistance, no paresthesia and negative IV test ? ?Additional Notes ?Reason for block:procedure for pain ? ? ? ?

## 2021-11-12 MED ORDER — IBUPROFEN 100 MG/5ML PO SUSP: 600.0000 mg | Freq: Four times a day (QID) | ORAL | 1 refills | Status: AC

## 2021-11-12 NOTE — Progress Notes (Signed)
Post Partum Day 1 ?Subjective: ?no complaints, voiding, and tolerating PO. Kelsey Snyder reports no distress, rested comfortably throughout out.  ? ?Objective: ?Blood pressure 113/80, pulse 94, temperature 98.1 ?F (36.7 ?C), temperature source Oral, resp. rate 16, height 5\' 3"  (1.6 m), weight 93.9 kg, SpO2 100 %, unknown if currently breastfeeding. ? ?Physical Exam:  ?General: alert, cooperative, and no distress ?Lochia: appropriate ?Uterine Fundus: firm ?Incision: n/a ?Perineum: non-edematous, intact, well approximated ?DVT Evaluation: No evidence of DVT seen on physical exam. ?No cords or calf tenderness. ?No significant calf/ankle edema. ? ?Recent Labs  ?  11/11/21 ?0758  ?HGB 10.2*  ?HCT 31.9*  ? ? ?Assessment/Plan: ?[redacted]w[redacted]d s/p SVD. Pregnancy uncomplicated. PP course has been routine. ?Discharge home and Contraception none. ?Postpartum Teaching:   ?Discussed with patient appropriate postpartum nutrition, hydration, breastfeeding, activity, avoiding vaginal insertion until lochia complete, and typical postpartum course. Patient advised to call with fever greater than 101, excessive bleeding or passing large clots, uncontrolled pain, s/s of postpartum depression, or s/s of DVT.   ?F/u 4 weeks at [facility] for routine PP visit.  ? ? LOS: 1 day  ? ?Kelsey Snyder S Brigitte Soderberg,SNM ?11/12/2021, 7:41 AM  ? ? ?

## 2021-11-12 NOTE — Anesthesia Postprocedure Evaluation (Signed)
Anesthesia Post Note ? ?Patient: Kelsey Snyder ? ?Procedure(s) Performed: AN AD HOC LABOR EPIDURAL ? ?  ? ?Patient location during evaluation: Mother Baby ?Anesthesia Type: Epidural ?Level of consciousness: awake and alert and oriented ?Pain management: satisfactory to patient ?Vital Signs Assessment: post-procedure vital signs reviewed and stable ?Respiratory status: spontaneous breathing and nonlabored ventilation ?Cardiovascular status: stable ?Postop Assessment: no headache, no backache, no signs of nausea or vomiting, adequate PO intake, patient able to bend at knees, able to ambulate and no apparent nausea or vomiting (patient up walking) ?Anesthetic complications: no ? ? ?No notable events documented. ? ?Last Vitals:  ?Vitals:  ? 11/12/21 0521 11/12/21 0856  ?BP: 113/80 132/81  ?Pulse: 94 96  ?Resp: 16 17  ?Temp: 36.7 ?C 36.9 ?C  ?SpO2: 100% 100%  ?  ?Last Pain:  ?Vitals:  ? 11/12/21 0856  ?TempSrc: Oral  ?PainSc:   ? ?Pain Goal: Patients Stated Pain Goal: 10 (11/11/21 5697) ? ?  ?  ?  ?  ?  ?  ?  ? ?Kristee Angus ? ? ? ? ?

## 2021-11-12 NOTE — Progress Notes (Signed)
CSW received consult for late PNC.  CSW reviewed chart and is screening out consult as it does not meet criteria for automatic CSW involvement and infant drug screening.  MOB started care prior to 28 weeks and had more than 3 visits.  Please contact CSW if current concerns arise or by MOB's request. ? ?While reviewing chart hx of marijuana use noted initial prenatal care note "Marijuana use- quit with + UPT ".  Referral was screened out due to the following: ?~MOB had no documented substance use after initial prenatal visit/+UPT. ?~MOB had no positive drug screens after initial prenatal visit/+UPT. ? ?Please consult CSW if current concerns arise or by MOB's request. ? ?CSW will monitor CDS results and make report to Child Protective Services if warranted. ? ?Aleathia Purdy, LCSW ?Clinical Social Worker ?[facility] ?Cell#: [phone] ? ? ? ? ?

## 2021-11-14 NOTE — Addendum Note (Signed)
Addendum  created 11/14/21 2148 by Lewie Loron, MD  ? Intraprocedure Staff edited  ?  ?

## 2021-11-22 ENCOUNTER — Telehealth (HOSPITAL_COMMUNITY): Payer: Self-pay | Admitting: *Deleted

## 2021-11-22 NOTE — Telephone Encounter (Signed)
Hospital Discharge Follow-Up Call: ? ?Patient reports that she is well and has no concerns about her healing process.  EPDS today was 4 and she endorses this accurately reflects that she is doing well emotionally. ? ?Patient says that baby is well and she has no concerns about baby's health.  She reports that baby sleeps in a crib beside her bed.  Reviewed ABCs of Safe Sleep. ?

## 2023-03-28 ENCOUNTER — Encounter (HOSPITAL_COMMUNITY): Payer: Self-pay

## 2023-03-28 ENCOUNTER — Other Ambulatory Visit: Payer: Self-pay

## 2023-03-28 ENCOUNTER — Emergency Department (HOSPITAL_COMMUNITY)
Admission: EM | Admit: 2023-03-28 | Discharge: 2023-03-28 | Disposition: A | Discharge status: Home / Self Care | Payer: Medicaid Other | Attending: Emergency Medicine | Admitting: Emergency Medicine

## 2023-03-28 DIAGNOSIS — R531 Weakness: Secondary | ICD-10-CM | POA: Diagnosis present

## 2023-03-28 DIAGNOSIS — Z3490 Encounter for supervision of normal pregnancy, unspecified, unspecified trimester: Secondary | ICD-10-CM

## 2023-03-28 NOTE — ED Triage Notes (Signed)
Pt reports being 3 months pregnant, has been feeling weak and fatigued since Friday with aches and chills and decreased appetite. Denies OB complaints.

## 2023-03-28 NOTE — ED Provider Notes (Signed)
[place] EMERGENCY DEPARTMENT AT [facility] Provider Note   CSN: [id number] Arrival date & time: 03/28/23  1410     History  Chief Complaint  Patient presents with   Fatigue   decreased appetite    Kelsey Snyder is a 27 y.o. female.  Patient presents to the emergency department for evaluation of generalized weakness, fatigue and decreased appetite.  She is approximately 3 months pregnant.  She states that over the past 5 days she has been very fatigued and unable to get out of bed to go to work.  She is requesting a work note.  She denies associated symptoms such as fever, URI symptoms.  She has had some nausea and decreased appetite.  She denies significant morning sickness or vomiting.  She denies abdominal pain, pelvic pain, back pain.  No vaginal bleeding or discharge.  She is urinating normally.  Last ate last evening.  She states that her primary care doctor told her to drink more water.  She was unable to get to the office today due to not having gas.  Other than decreased appetite currently denying any acute complaints.       Home Medications Prior to Admission medications   Medication Sig Start Date End Date Taking? Authorizing Provider  ibuprofen (ADVIL) 100 MG/5ML suspension Take 30 mLs (600 mg total) by mouth every 6 (six) hours. 11/12/21   Arabella Merles, CNM  Prenatal Vit-Fe Fumarate-FA (MULTIVITAMIN-PRENATAL) 27-0.8 MG TABS tablet Take 1 tablet by mouth daily at 12 noon.    [provider]      Allergies    Amoxicillin    Review of Systems   Review of Systems  Physical Exam Updated Vital Signs BP 125/83 (BP Location: Left Arm)   Pulse 91   Temp 98.7 F (37.1 C) (Oral)   Resp 20   Ht 5\' 3"  (1.6 m)   Wt 84.4 kg   SpO2 100%   BMI 32.95 kg/m  Physical Exam Vitals and nursing note reviewed.  Constitutional:      General: She is not in acute distress.    Appearance: She is well-developed.  HENT:     Head: Normocephalic and  atraumatic.     Right Ear: External ear normal.     Left Ear: External ear normal.     Nose: Nose normal.     Mouth/Throat:     Mouth: Mucous membranes are moist.  Eyes:     Conjunctiva/sclera: Conjunctivae normal.  Cardiovascular:     Rate and Rhythm: Normal rate and regular rhythm.     Heart sounds: No murmur heard. Pulmonary:     Effort: No respiratory distress.     Breath sounds: No wheezing, rhonchi or rales.  Abdominal:     Palpations: Abdomen is soft.     Tenderness: There is no abdominal tenderness. There is no guarding or rebound.  Musculoskeletal:     Cervical back: Normal range of motion and neck supple.     Right lower leg: No edema.     Left lower leg: No edema.  Skin:    General: Skin is warm and dry.     Findings: No rash.  Neurological:     General: No focal deficit present.     Mental Status: She is alert. Mental status is at baseline.     Motor: No weakness.  Psychiatric:        Mood and Affect: Mood normal.     ED Results / Procedures /  Treatments   Labs (all labs ordered are listed, but only abnormal results are displayed) Labs Reviewed - No data to display  EKG None  Radiology No results found.  Procedures Procedures    Medications Ordered in ED Medications - No data to display  ED Course/ Medical Decision Making/ A&P    Patient seen and examined. History obtained directly from patient.   Labs/EKG: None ordered  Imaging: None ordered  Medications/Fluids: None ordered  Most recent vital signs reviewed and are as follows: BP 125/83 (BP Location: Left Arm)   Pulse 91   Temp 98.7 F (37.1 C) (Oral)   Resp 20   Ht 5\' 3"  (1.6 m)   Wt 84.4 kg   SpO2 100%   BMI 32.95 kg/m   Initial impression: Fatigue and anorexia in pregnancy, no current complaints otherwise.  Patient looks well.  I did discuss with and offer lab testing and IV fluids and she declines given that she is currently feeling okay.  She is requesting work  note.  Plan: Discharge to home.   Prescriptions written for: None  Other home care instructions discussed: Maintain good hydration  ED return instructions discussed: Return with worsening symptoms, persistent vomiting, abdominal pain or back pain, vaginal bleeding or discharge.  Follow-up instructions discussed: Patient encouraged to follow-up with their PCP in 3 days.                             Medical Decision Making  Patient with fatigue and generalized weakness in pregnancy.  No acute concerns on exam today.  No vaginal bleeding or discharge to suggest impending miscarriage.  She has not been vomiting.  She is urinating normally per her report.  Vital signs are reassuring.  She can continue to hydrate well at home.  She does not require IV fluids or lab work today.  Patient in agreement.        Final Clinical Impression(s) / ED Diagnoses Final diagnoses:  Weakness  Pregnancy, unspecified gestational age    Rx / DC Orders ED Discharge Orders     None         Renne Crigler, PA-C 03/28/23 1538    [address], [place] K, DO 03/28/23 2315

## 2023-03-28 NOTE — Discharge Instructions (Addendum)
Please follow-up with your doctor or your OB/GYN in the next 3 days.  Return with worsening symptoms or other concerns.

## 2023-07-21 ENCOUNTER — Encounter (HOSPITAL_COMMUNITY): Payer: Self-pay

## 2023-07-21 ENCOUNTER — Other Ambulatory Visit: Payer: Self-pay

## 2023-07-21 ENCOUNTER — Emergency Department (HOSPITAL_COMMUNITY)
Admission: EM | Admit: 2023-07-21 | Discharge: 2023-07-21 | Disposition: A | Discharge status: Home / Self Care | Payer: Medicaid Other | Attending: Student | Admitting: Student

## 2023-07-21 DIAGNOSIS — J069 Acute upper respiratory infection, unspecified: Secondary | ICD-10-CM | POA: Insufficient documentation

## 2023-07-21 DIAGNOSIS — Z1152 Encounter for screening for COVID-19: Secondary | ICD-10-CM | POA: Insufficient documentation

## 2023-07-21 DIAGNOSIS — O99512 Diseases of the respiratory system complicating pregnancy, second trimester: Secondary | ICD-10-CM | POA: Insufficient documentation

## 2023-07-21 LAB — SARS CORONAVIRUS 2 BY RT PCR: SARS Coronavirus 2 by RT PCR: NEGATIVE

## 2023-07-21 NOTE — ED Triage Notes (Signed)
Pt to ED by POV from home with c/o a sore throat, cough, and headache which began 2-3 days ago. Pt is currently pregnant (EDD 10/19/2023), denies any fevers. Arrives A+O, vss, NADN.

## 2023-07-21 NOTE — Discharge Instructions (Signed)
Your work-up in the ER today was reassuring for acute findings. You were swabbed for covid, flu, rsv which were negative. However, your symptoms are still likely related to an upper respiratory infection. As these are almost always viral in nature, no antibiotics are indicated. I recommend that you get plenty of rest and focus on symptomatic relief which includes tylenol for fever and bodyaches and cough drops for sore throat. I also recommend:  Increased fluid intake. Sports drinks offer valuable electrolytes, sugars, and fluids.  Breathing heated mist or steam (vaporizer or shower).  Eating chicken soup or other clear broths, and maintaining good nutrition.   Increasing usage of your inhaler if you have asthma.  Return to work when your temperature has returned to normal.  Gargle warm salt water and spit it out for sore throat.   Follow Up: Follow up with your primary care doctor in 5-7 days for recheck of ongoing symptoms.  Return to emergency department for emergent changing or worsening of symptoms.  Additionally, please follow-up with your OB/GYN at your next scheduled appointment.

## 2023-07-21 NOTE — ED Provider Notes (Signed)
[facility] EMERGENCY DEPARTMENT AT [facility] Provider Note   CSN: [id number] Arrival date & time: 07/21/23  0054     History  Chief Complaint  Patient presents with   Sore Throat    Kelsey Snyder is a 27 y.o. female.  Patient with noncontributory past medical history presents today with complaints of cough and congestion. She states that same began 2-3 days ago and has been persistent since then. She states she is 7 months pregnant and has been followed by OB/GYN for same without any complications. Given she is pregnant she has not tried anything for her symptoms. Her other kids are home sick with similar symptoms and her 4 month old is here with her sick with similar symptoms. She denies any chest pain or shortness of breath. She does note a sore throat as well but is able to swallow without issue. Denies fevers or chills.   The history is provided by the patient. No language interpreter was used.  Sore Throat       Home Medications Prior to Admission medications   Medication Sig Start Date End Date Taking? Authorizing Provider  ibuprofen (ADVIL) 100 MG/5ML suspension Take 30 mLs (600 mg total) by mouth every 6 (six) hours. 11/12/21   Arabella Merles, CNM  Prenatal Vit-Fe Fumarate-FA (MULTIVITAMIN-PRENATAL) 27-0.8 MG TABS tablet Take 1 tablet by mouth daily at 12 noon.    [provider]      Allergies    Amoxicillin    Review of Systems   Review of Systems  HENT:  Positive for congestion.   Respiratory:  Positive for cough.   All other systems reviewed and are negative.   Physical Exam Updated Vital Signs BP (!) 150/67 (BP Location: Right Arm)   Pulse (!) 112   Temp 98.6 F (37 C) (Oral)   Resp 18   Ht 5\' 3"  (1.6 m)   Wt 87.5 kg   SpO2 98%   BMI 34.19 kg/m  Physical Exam Vitals and nursing note reviewed.  Constitutional:      General: She is not in acute distress.    Appearance: Normal appearance. She is normal weight. She is not  ill-appearing, toxic-appearing or diaphoretic.  HENT:     Head: Normocephalic and atraumatic.     Right Ear: Tympanic membrane and ear canal normal.     Left Ear: Tympanic membrane and ear canal normal.     Mouth/Throat:     Mouth: Mucous membranes are moist.     Pharynx: Uvula midline.     Tonsils: No tonsillar exudate or tonsillar abscesses.  Cardiovascular:     Rate and Rhythm: Normal rate and regular rhythm.     Heart sounds: Normal heart sounds.  Pulmonary:     Effort: Pulmonary effort is normal. No respiratory distress.     Breath sounds: Normal breath sounds.  Abdominal:     Comments: Gravid abdomen  Musculoskeletal:        General: Normal range of motion.     Cervical back: Normal range of motion and neck supple.  Skin:    General: Skin is warm and dry.  Neurological:     General: No focal deficit present.     Mental Status: She is alert.  Psychiatric:        Mood and Affect: Mood normal.        Behavior: Behavior normal.     ED Results / Procedures / Treatments   Labs (all labs ordered are  listed, but only abnormal results are displayed) Labs Reviewed  SARS CORONAVIRUS 2 BY RT PCR    EKG None  Radiology No results found.  Procedures Procedures    Medications Ordered in ED Medications - No data to display  ED Course/ Medical Decision Making/ A&P                                 Medical Decision Making  Patient presents today with complaints of cough and congestion x 2-3 days.  They are afebrile, nontoxic-appearing, and in no acute distress with reassuring vital signs.  Physical exam reveals lung sounds clear to auscultation in all fields. No indication for CXR imaging at this time. Patient negative for COVID, flu, and RSV.  However, patient's symptoms likely due to URI, likely viral etiology.  Discussed with patient that there is no indication for antibiotics for viral infections. Given she is pregnant, recommend tylenol for bodyaches and OTC cough  drops for sore throat. Additionally, of note patients blood pressure was initially 150/67, however recheck 121/74, therefore no further evaluation indicated. Patient without headache, edema, or abdominal pain. Evaluation and diagnostic testing in the emergency department does not suggest an emergent condition requiring admission or immediate intervention beyond what has been performed at this time.  Plan for discharge with close PCP/OB/GYN follow-up.  Patient is understanding and amenable with plan, educated on red flag symptoms that would prompt immediate return.  Patient discharged in stable condition.  Final Clinical Impression(s) / ED Diagnoses Final diagnoses:  Viral URI with cough    Rx / DC Orders ED Discharge Orders     None     An After Visit Summary was printed and given to the patient.     Vear Clock 07/21/23 [id number]    Glendora Score, MD 07/21/23 [phone]
